# Patient Record
Sex: Female | Born: 2000 | Race: White | Hispanic: No | Marital: Single | Smoking: Never smoker
Health system: Southern US, Community
[De-identification: ages and names within clinical notes are randomized; demographics above are authoritative.]

## PROBLEM LIST (undated history)

## (undated) DIAGNOSIS — E079 Disorder of thyroid, unspecified: Secondary | ICD-10-CM

## (undated) DIAGNOSIS — F424 Excoriation (skin-picking) disorder: Secondary | ICD-10-CM

## (undated) DIAGNOSIS — F909 Attention-deficit hyperactivity disorder, unspecified type: Secondary | ICD-10-CM

## (undated) DIAGNOSIS — H9193 Unspecified hearing loss, bilateral: Secondary | ICD-10-CM

## (undated) DIAGNOSIS — R5383 Other fatigue: Secondary | ICD-10-CM

## (undated) DIAGNOSIS — F32A Depression, unspecified: Secondary | ICD-10-CM

## (undated) DIAGNOSIS — F429 Obsessive-compulsive disorder, unspecified: Secondary | ICD-10-CM

## (undated) DIAGNOSIS — L409 Psoriasis, unspecified: Secondary | ICD-10-CM

## (undated) DIAGNOSIS — E063 Autoimmune thyroiditis: Secondary | ICD-10-CM

## (undated) HISTORY — DX: Depression, unspecified: F32.A

## (undated) HISTORY — DX: Excoriation (skin-picking) disorder: F42.4

## (undated) HISTORY — DX: Unspecified hearing loss, bilateral: H91.93

## (undated) HISTORY — DX: Autoimmune thyroiditis: E06.3

## (undated) HISTORY — DX: Disorder of thyroid, unspecified: E07.9

## (undated) HISTORY — DX: Other fatigue: R53.83

## (undated) HISTORY — DX: Attention-deficit hyperactivity disorder, unspecified type: F90.9

## (undated) HISTORY — DX: Psoriasis, unspecified: L40.9

## (undated) HISTORY — DX: Obsessive-compulsive disorder, unspecified: F42.9

---
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
## 2020-01-01 ENCOUNTER — Other Ambulatory Visit: Payer: Self-pay | Admitting: Psychiatry

## 2020-01-01 DIAGNOSIS — F341 Dysthymic disorder: Secondary | ICD-10-CM

## 2020-01-01 DIAGNOSIS — F428 Other obsessive-compulsive disorder: Secondary | ICD-10-CM

## 2020-01-02 NOTE — Telephone Encounter (Signed)
Due back now

## 2020-02-01 ENCOUNTER — Other Ambulatory Visit: Payer: Self-pay | Admitting: Psychiatry

## 2020-02-01 DIAGNOSIS — F428 Other obsessive-compulsive disorder: Secondary | ICD-10-CM

## 2020-02-01 DIAGNOSIS — F341 Dysthymic disorder: Secondary | ICD-10-CM

## 2020-02-03 NOTE — Telephone Encounter (Signed)
Last apt 09/29 due back 4 weeks

## 2020-02-18 ENCOUNTER — Telehealth: Payer: Self-pay | Admitting: Psychiatry

## 2020-02-18 DIAGNOSIS — F341 Dysthymic disorder: Secondary | ICD-10-CM

## 2020-02-18 DIAGNOSIS — F428 Other obsessive-compulsive disorder: Secondary | ICD-10-CM

## 2020-02-18 MED ORDER — FLUVOXAMINE MALEATE 100 MG PO TABS: 200.0000 mg | ORAL_TABLET | Freq: Every day | ORAL | 0 refills | Status: AC

## 2020-02-18 NOTE — Telephone Encounter (Signed)
[facility] on [address]. faxes for 90-day supply unable to do so as I retire in 3 weeks patient not responding to reminders for follow-up notifying this is my last 30-day emergency supply of Luvox 200 mg every bedtime

## 2020-03-20 ENCOUNTER — Ambulatory Visit: Payer: BC Managed Care – PPO | Admitting: Psychiatry

## 2023-02-11 ENCOUNTER — Ambulatory Visit (HOSPITAL_COMMUNITY)
Admission: EM | Admit: 2023-02-11 | Discharge: 2023-02-11 | Disposition: A | Discharge status: Home / Self Care | Payer: BC Managed Care – PPO

## 2023-02-11 DIAGNOSIS — Z7689 Persons encountering health services in other specified circumstances: Secondary | ICD-10-CM

## 2023-02-11 DIAGNOSIS — R4689 Other symptoms and signs involving appearance and behavior: Secondary | ICD-10-CM | POA: Diagnosis not present

## 2023-02-11 NOTE — ED Provider Notes (Incomplete)
[facility] Medical Screening Exam  Patient Name: Stephanie Villa MRN: [id number] Date of Evaluation: 02/11/23 Chief Complaint:   Diagnosis:  Final diagnoses:  Encounter for psychiatric assessment    History of Present illness: Stephanie Villa is a 22 y.o. female. ***  Flowsheet Row ED from 02/11/2023 in [facility]  C-SSRS RISK CATEGORY No Risk       Psychiatric Specialty Exam  Presentation  General Appearance:No data recorded Eye Contact:No data recorded Speech:No data recorded Speech Volume:No data recorded Handedness:No data recorded  Mood and Affect  Mood:No data recorded Affect:No data recorded  Thought Process  Thought Processes:No data recorded Descriptions of Associations:No data recorded Orientation:No data recorded Thought Content:No data recorded   Hallucinations:No data recorded Ideas of Reference:No data recorded Suicidal Thoughts:No data recorded Homicidal Thoughts:No data recorded  Sensorium  Memory:No data recorded Judgment:No data recorded Insight:No data recorded  Executive Functions  Concentration:No data recorded Attention Span:No data recorded Recall:No data recorded Fund of Knowledge:No data recorded Language:No data recorded  Psychomotor Activity  Psychomotor Activity:No data recorded  Assets  Assets:No data recorded  Sleep  Sleep:No data recorded Number of hours: No data recorded  Physical Exam: Physical Exam ROS Blood pressure 121/81, pulse 69, temperature 98.9 F (37.2 C), temperature source Oral, resp. rate 20, SpO2 100%. There is no height or weight on file to calculate BMI.  Musculoskeletal: Strength & Muscle Tone: {desc; muscle tone:32375} Gait & Station: {PE GAIT ED NATL:22525} Patient leans: {Patient Leans:[id number]}   [facility] MSE Discharge Disposition for Follow up and Recommendations: {[facility] MSE Recommendations:24277}   Peggi Yono L, NP 02/11/2023, 3:05 PM

## 2023-02-11 NOTE — Progress Notes (Signed)
   02/11/23 1357  [facility] Triage Screening (Walk-ins at [facility] only)  How Did You Hear About [place]? Family/Friend  What Is the Reason for Your Visit/Call Today? Stephanie Villa is a 22 year old female presenting to [facility] accompanied by her father. Pt is diagnosed with ADHD, Depression and Anxiety. Pt mentions that she felt "odd" last night and unsure on what is wrong with her. Pt reports she is currently taking lexapro to help with her depression and anxiety. Pt is also taking 3 other medications but is unable to recall the names. Pt reports that she sees a therapist on occasions. Pt is looking for any resources available to help with this ongoing feeling of "not being okay". Pt denies substance use, SI, HI and AVH.  How Long Has This Been Causing You Problems? <Week  Have You Recently Had Any Thoughts About Hurting Yourself? No  Are You Planning to Commit Suicide/Harm Yourself At This time? No  Have you Recently Had Thoughts About Hurting Someone Karolee Ohs? No  Are You Planning To Harm Someone At This Time? No  Physical Abuse Denies  Verbal Abuse Denies  Sexual Abuse Denies  Exploitation of patient/patient's resources Denies  Self-Neglect Denies  Possible abuse reported to: Other (Comment)  Are you currently experiencing any auditory, visual or other hallucinations? No  Have You Used Any Alcohol or Drugs in the Past 24 Hours? No  Do you have any current medical co-morbidities that require immediate attention? No  Clinician description of patient physical appearance/behavior: tearful, cooperative  What Do You Feel Would Help You the Most Today? Stress Management;Medication(s)  If access to [facility] was not available, would you have sought care in the Emergency Department? No  Determination of Need Routine (7 days)  Options For Referral Intensive Outpatient Therapy;Medication Management

## 2023-02-11 NOTE — Discharge Instructions (Addendum)
Discharge recommendations:   Medications: Patient is to take medications as prescribed. No medication changes were made during your visit. The patient or patient's guardian is to contact a medical professional and/or outpatient provider to address any new side effects that develop. The patient or the patient's guardian should update outpatient providers of any new medications and/or medication changes.   Outpatient Follow up: Please follow up with the [facility] for medication management and [facility] for therapy.   Therapy: We recommend that patient participate in individual weekly therapy to address mental health concerns.  Safety:   The following safety precautions should be taken:   No sharp objects. This includes scissors, razors, scrapers, and putty knives.   Chemicals should be removed and locked up.   Medications should be removed and locked up.   Weapons should be removed and locked up. This includes firearms, knives and instruments that can be used to cause injury.   The patient should abstain from use of illicit substances/drugs and abuse of any medications.  If symptoms worsen or do not continue to improve or if the patient becomes actively suicidal or homicidal then it is recommended that the patient return to the closest hospital emergency department, the [facility], or call 911 for further evaluation and treatment. National Suicide Prevention Lifeline 1-800-SUICIDE or 6783419702.  About 988 988 offers 24/7 access to trained crisis counselors who can help people experiencing mental health-related distress. People can call or text 988 or chat 988lifeline.org for themselves or if they are worried about a loved one who may need crisis support.

## 2023-02-11 NOTE — ED Provider Notes (Cosign Needed Addendum)
Behavioral Health Urgent Care Medical Screening Exam  Patient Name: Stephanie Villa MRN: [id number] Date of Evaluation: 02/11/23 Chief Complaint:  "feeling odd" Diagnosis: Encounter for psychiatric assessment  History of Present illness: Stephanie Villa is a 22 y.o. female patient with a history of OCD, ADHD, MDD and anxiety who presents to the [facility] behavioral health urgent care voluntary accompanied by her father with complaints of feeling "odd."  Patient seen and evaluated face-to-face by this provider without her father present, chart reviewed and case discussed with Dr. Clovis Riley.  On evaluation, patient is alert and oriented x 4. Her thought process is linear and speech is clear and coherent. Her mood is dysphoric and affect is congruent. She has fair eye contact. She is casually dressed. She is calm and cooperative and does not appear to be in acute distress. Patient denies SI/HI/AVH. There is no objective evidence that the patient is currently responding to internal or external stimuli.   Patient states that she started experiencing depersonalization around 4 PM yesterday. She describes it as an out of body experience, does not feel like her thoughts are her own thoughts and no emotion tied to her thoughts. When asked to describe her thoughts, she states that it's really not any thoughts and it is more like a feeling, like she is playing a video game. She denies past history of depersonalization. She denies recent stressors, lifestyle changes, or triggers attributing to her symptoms. She denies recent medications changes and states that she's been taking lexapro and Vraylar for the past year. She denies drinking alcohol or using illicit drugs. She denies depressive symptoms and states that she's been feeling well overall. She reports good sleep. She reports a good appetite. She reports outpatient psychiatry with the [facility] and therapy with [facility]. When asked if she  reached out to her outpatient providers, she states no. She states that her next appointment is in two weeks.   Plan of care: Patient recommended to follow up with her primary outpatient psychiatrist for medication management and outpatient counselor for therapy to address mental health concerns. Patient provided with mindfulness based stress reduction education on how to develop self awareness to help respond to stress or negative feelings though techniques such as breathing exercises, and meditation. Safety planning completed at the time of discharge, patient to return to the [facility], nearest emergency department or call 911 for a crisis evaluation if symptoms worsen.    Flowsheet Row ED from [facility]/11/2023 in [facility]  C-SSRS RISK CATEGORY No Risk       Psychiatric Specialty Exam  Presentation  General Appearance:Appropriate for Environment  Eye Contact:Fair  Speech:Clear and Coherent  Speech Volume:Decreased  Handedness:Right   Mood and Affect  Mood: Depressed  Affect: Congruent   Thought Process  Thought Processes: Coherent  Descriptions of Associations:Intact  Orientation:Full (Time, Place and Person)  Thought Content:Logical    Hallucinations:None  Ideas of Reference:None  Suicidal Thoughts:No  Homicidal Thoughts:No   Sensorium  Memory: Immediate Fair; Recent Fair; Remote Fair  Judgment: Fair  Insight: Fair   Art therapist  Concentration: Fair  Attention Span: Fair  Recall: [employer] of Knowledge: Fair  Language: Fair   Psychomotor Activity  Psychomotor Activity: Normal   Assets  Assets: Manufacturing systems engineer; Desire for Improvement; Financial Resources/Insurance; Housing; Leisure Time; Physical Health; Vocational/Educational   Sleep  Sleep: Fair  Number of hours:  8   Physical Exam: Physical Exam HENT:     Head:  Normocephalic.     Nose: Nose normal.  Cardiovascular:     Rate  and Rhythm: Normal rate.  Pulmonary:     Effort: Pulmonary effort is normal.  Musculoskeletal:     Cervical back: Normal range of motion.  Neurological:     Mental Status: She is alert and oriented to person, place, and time.    Review of Systems  Constitutional: Negative.   HENT: Negative.    Eyes: Negative.   Respiratory: Negative.    Cardiovascular: Negative.   Gastrointestinal: Negative.   Genitourinary: Negative.   Neurological: Negative.   Endo/Heme/Allergies: Negative.    Blood pressure 121/81, pulse 69, temperature 98.9 F (37.2 C), temperature source Oral, resp. rate 20, SpO2 100%. There is no height or weight on file to calculate BMI.  Musculoskeletal: Strength & Muscle Tone: within normal limits Gait & Station: normal Patient leans: N/A   [facility] MSE Discharge Disposition for Follow up and Recommendations: Based on my evaluation the patient does not appear to have an emergency medical condition and can be discharged with resources and follow up care in outpatient services for Medication Management and Individual Therapy  Discharge recommendations:   Medications: Patient is to take medications as prescribed. No medication changes were made during your visit. The patient or patient's guardian is to contact a medical professional and/or outpatient provider to address any new side effects that develop. The patient or the patient's guardian should update outpatient providers of any new medications and/or medication changes.   Outpatient Follow up: Please follow up with the [facility] for medication management and [facility] for therapy.   Therapy: We recommend that patient participate in individual weekly therapy to address mental health concerns.  Safety:   The following safety precautions should be taken:   No sharp objects. This includes scissors, razors, scrapers, and putty knives.   Chemicals should be removed and locked up.   Medications  should be removed and locked up.   Weapons should be removed and locked up. This includes firearms, knives and instruments that can be used to cause injury.   The patient should abstain from use of illicit substances/drugs and abuse of any medications.  If symptoms worsen or do not continue to improve or if the patient becomes actively suicidal or homicidal then it is recommended that the patient return to the closest hospital emergency department, the [facility], or call 911 for further evaluation and treatment. National Suicide Prevention Lifeline 1-800-SUICIDE or 316-432-8575.  About 988 988 offers 24/7 access to trained crisis counselors who can help people experiencing mental health-related distress. People can call or text 988 or chat 988lifeline.org for themselves or if they are worried about a loved one who may need crisis support.      Azuri Bozard L, NP 02/11/2023, 3:10 PM

## 2023-04-04 ENCOUNTER — Other Ambulatory Visit: Payer: Self-pay | Admitting: Gastroenterology

## 2023-04-04 DIAGNOSIS — R112 Nausea with vomiting, unspecified: Secondary | ICD-10-CM

## 2023-04-13 ENCOUNTER — Ambulatory Visit
Admission: RE | Admit: 2023-04-13 | Discharge: 2023-04-13 | Disposition: A | Discharge status: Home / Self Care | Payer: Self-pay | Source: Ambulatory Visit | Attending: Gastroenterology | Admitting: Gastroenterology

## 2023-04-13 DIAGNOSIS — R112 Nausea with vomiting, unspecified: Secondary | ICD-10-CM
